# Patient Record
Sex: Male | Born: 1970 | Race: White | Hispanic: No | Marital: Married | Smoking: Never smoker
Health system: Southern US, Community
[De-identification: ages and names within clinical notes are randomized; demographics above are authoritative.]

## PROBLEM LIST (undated history)

## (undated) DIAGNOSIS — I10 Essential (primary) hypertension: Secondary | ICD-10-CM

## (undated) DIAGNOSIS — J45909 Unspecified asthma, uncomplicated: Secondary | ICD-10-CM

## (undated) DIAGNOSIS — L4050 Arthropathic psoriasis, unspecified: Secondary | ICD-10-CM

## (undated) HISTORY — PX: HERNIA REPAIR: SHX51

---

## 2010-09-01 ENCOUNTER — Ambulatory Visit: Payer: Self-pay | Admitting: Internal Medicine

## 2012-02-01 IMAGING — CR DG RIBS 2V*L*
1 series · 4 of 4 positions shown · non-contrast
Comparison: none

REASON FOR EXAM: fell, pain rib
COMMENTS:

PROCEDURE:     MDR - MDR RIBS LEFT UNILATERAL  - September 01, 2010  [DATE]
RESULT:     Images of the left ribs demonstrate no definite fracture or bony
destruction. The underlying lung appears to be fully inflated.

[Series 1: view not recorded · 0.17mm/px · 4 of 4 slices shown]
[im 1/4]
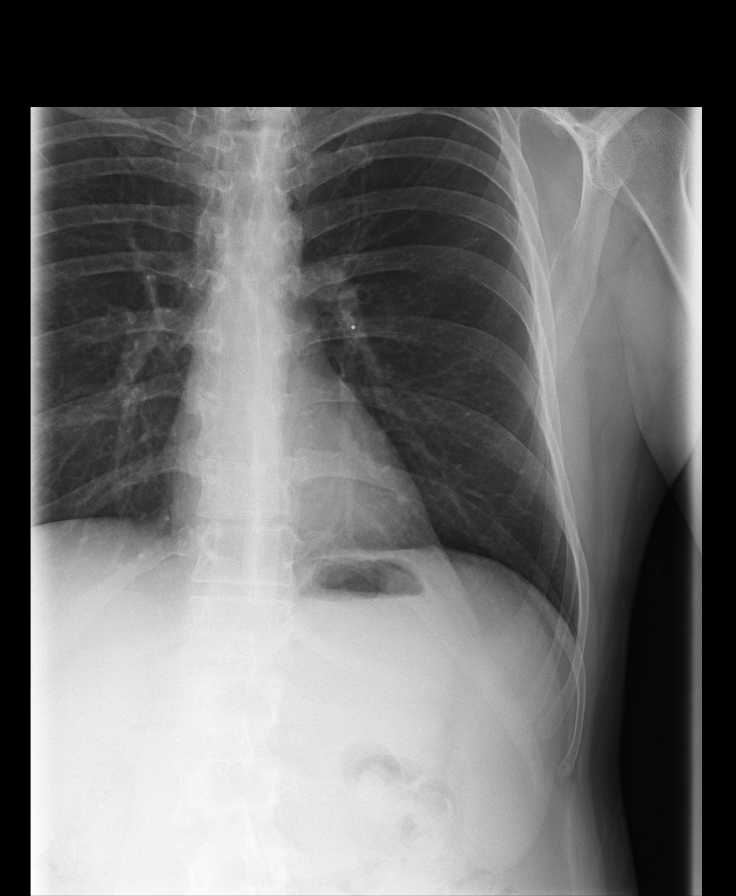
[im 2/4]
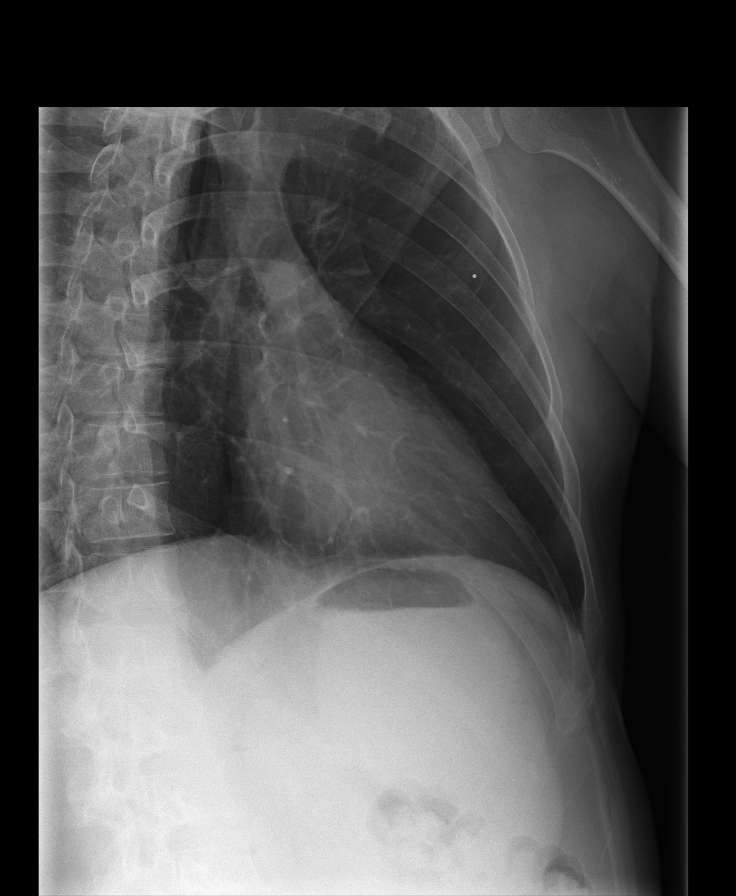
[im 3/4]
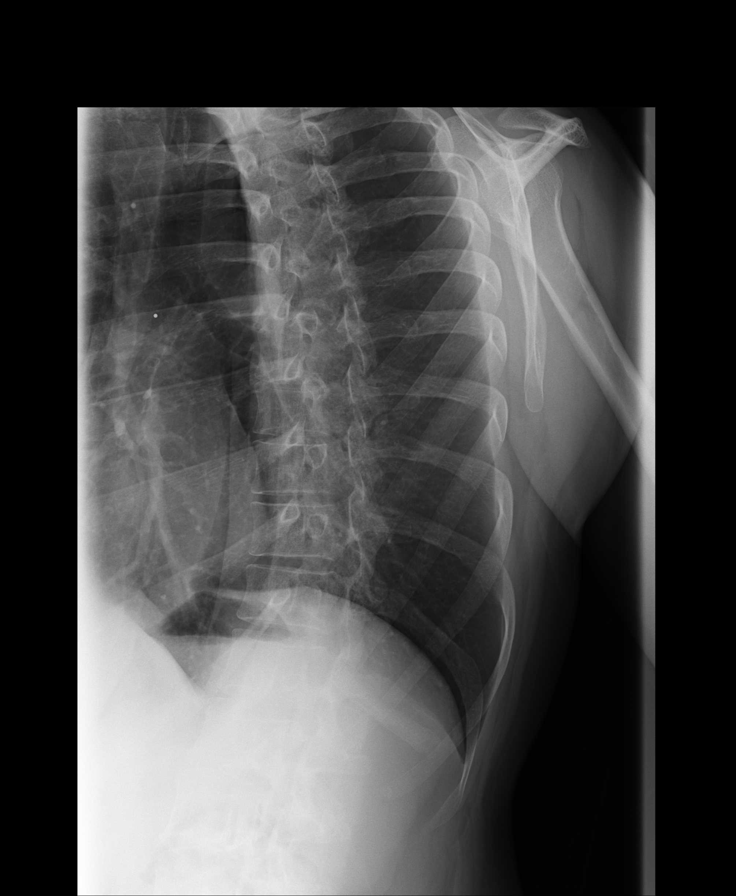
[im 4/4]
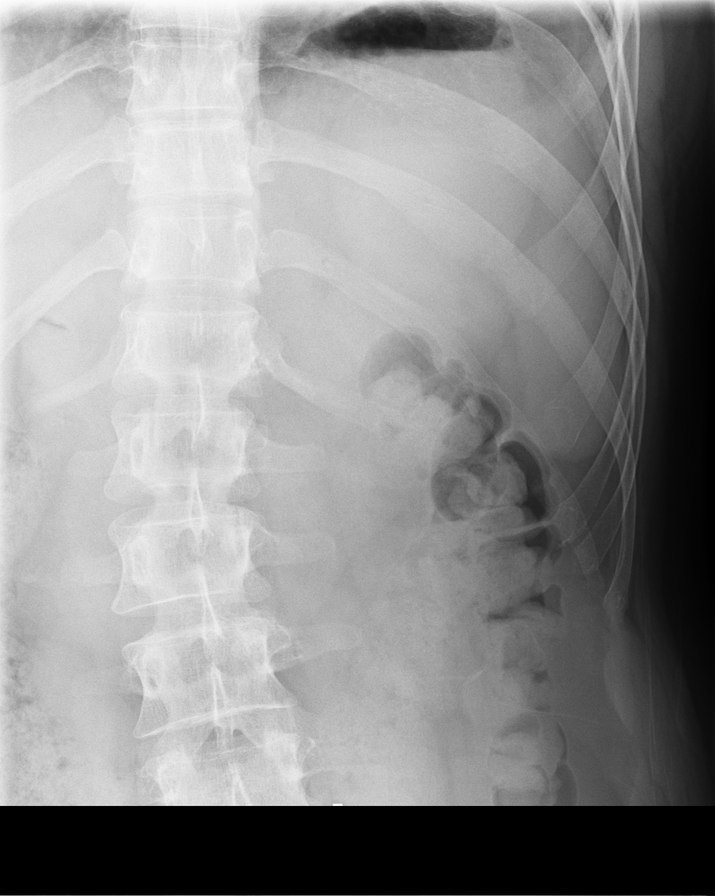

[4 of 4 positions shown; findings below may reference images not displayed]

IMPRESSION: No acute bony abnormality of the left ribs demonstrated.

## 2021-03-06 ENCOUNTER — Other Ambulatory Visit: Payer: Self-pay

## 2021-03-06 ENCOUNTER — Encounter: Payer: Self-pay | Admitting: Emergency Medicine

## 2021-03-06 ENCOUNTER — Ambulatory Visit
Admission: EM | Admit: 2021-03-06 | Discharge: 2021-03-06 | Disposition: A | Discharge status: Home / Self Care | Payer: BC Managed Care – PPO | Attending: Physician Assistant | Admitting: Physician Assistant

## 2021-03-06 DIAGNOSIS — R197 Diarrhea, unspecified: Secondary | ICD-10-CM | POA: Diagnosis not present

## 2021-03-06 DIAGNOSIS — Z20822 Contact with and (suspected) exposure to covid-19: Secondary | ICD-10-CM | POA: Diagnosis present

## 2021-03-06 HISTORY — DX: Essential (primary) hypertension: I10

## 2021-03-06 HISTORY — DX: Unspecified asthma, uncomplicated: J45.909

## 2021-03-06 HISTORY — DX: Arthropathic psoriasis, unspecified: L40.50

## 2021-03-06 NOTE — ED Provider Notes (Signed)
[facility]    CSN: [id number] Arrival date & time: 03/06/21  1607      History   Chief Complaint Chief Complaint  Patient presents with  . Headache    HPI Larry Thornton is a 50 y.o. male who presents with onset of fever less than 100 6 days ago and also had HA and thought it was related to his sinuses. As the day went on had mild ST and lower abd pain. Took med for HA and fever and fatigue. The med for HA did not help. Has had loose stool x 5 days, and some days were small firm stool. Some days is completely watery. Thought he was getting better, but now is watery again. Has taken pepto for a few days. The chest congestion which he keeps with his sinuses is actually better. Had negative Covid test the day of onset of symptom. The only thinks that has not resolved is his lower abd pain and diarrhea. In the past 24 h has gone 5 times. Denies blood in the stool. Denies antibiotic use or being out of the country in the past month.     Past Medical History:  Diagnosis Date  . Asthma   . Hypertension   . Psoriatic arthritis (HCC)     There are no problems to display for this patient.   Past Surgical History:  Procedure Laterality Date  . HERNIA REPAIR         Home Medications    Prior to Admission medications   Medication Sig Start Date End Date Taking? Authorizing Provider  Adalimumab (HUMIRA) 40 MG/0.8ML PSKT Inject into the skin. 11/07/14  Yes [provider]  cyanocobalamin 100 MCG tablet Take by mouth. 05/30/13  Yes [provider]  Fluticasone-Salmeterol (ADVAIR) 250-50 MCG/DOSE AEPB Inhale into the lungs. 08/20/14  Yes [provider]  folic acid (FOLVITE) 1 MG tablet Take 1 tablet by mouth daily. 05/19/16  Yes [provider]  Lifitegrast 5 % SOLN Apply to eye. 08/23/18  Yes [provider]  lisinopril (ZESTRIL) 10 MG tablet Take 1 tablet by mouth daily. 01/08/15  Yes [provider]  methotrexate  (RHEUMATREX) 2.5 MG tablet Take by mouth. 06/18/17  Yes [provider]  mometasone (NASONEX) 50 MCG/ACT nasal spray Place into the nose. 09/02/16  Yes [provider]  montelukast (SINGULAIR) 10 MG tablet Take by mouth. 11/07/18 05/29/21 Yes [provider]  pantoprazole (PROTONIX) 40 MG tablet Take by mouth. 09/09/15  Yes [provider]    Family History History reviewed. No pertinent family history.  Social History Social History   Tobacco Use  . Smoking status: Never Smoker  . Smokeless tobacco: Never Used  Vaping Use  . Vaping Use: Never used  Substance Use Topics  . Alcohol use: Yes  . Drug use: Not Currently     Allergies   Patient has no known allergies.   Review of Systems Review of Systems  Constitutional: Positive for fatigue and fever. Negative for appetite change, chills, diaphoresis and unexpected weight change.  HENT: Positive for postnasal drip and sinus pain. Negative for congestion, ear discharge, ear pain and sore throat.   Eyes: Negative for discharge.  Respiratory: Negative for cough.   Gastrointestinal: Positive for abdominal pain and diarrhea. Negative for abdominal distention, blood in stool, constipation, nausea and vomiting.  Musculoskeletal: Negative for gait problem and myalgias.  Skin: Negative for rash.  Neurological: Negative for headaches.  Hematological: Negative for adenopathy.  Physical Exam Triage Vital Signs ED Triage Vitals  Enc Vitals Group     BP 03/06/21 1622 (!) 153/101     Pulse Rate 03/06/21 1622 91     Resp 03/06/21 1622 18     Temp 03/06/21 1622 98.4 F (36.9 C)     Temp Source 03/06/21 1622 Oral     SpO2 03/06/21 1622 99 %     Weight 03/06/21 1621 180 lb (81.6 kg)     Height 03/06/21 1621 5\' 11"  (1.803 m)     Head Circumference --      Peak Flow --      Pain Score 03/06/21 1621 1     Pain Loc --      Pain Edu? --      Excl. in GC? --    No data found.  Updated Vital  Signs BP (!) 140/95 (BP Location: Left Arm)   Pulse 91   Temp 98.4 F (36.9 C) (Oral)   Resp 18   Ht 5\' 11"  (1.803 m)   Wt 180 lb (81.6 kg)   SpO2 99%   BMI 25.10 kg/m   Visual Acuity Right Eye Distance:   Left Eye Distance:   Bilateral Distance:    Right Eye Near:   Left Eye Near:    Bilateral Near:     Physical Exam Physical Exam Vitals signs and nursing note reviewed.  Constitutional:      General: he is not in acute distress.    Appearance: Normal appearance.he is not ill-appearing, toxic-appearing or diaphoretic.  HENT:     Head: Normocephalic.     Right Ear: Tympanic membrane, ear canal and external ear normal.     Left Ear: Tympanic membrane, ear canal and external ear normal.     Nose: Nose normal.     Mouth/Throat:     Mouth: Mucous membranes are moist.  Eyes:     General: No scleral icterus.       Right eye: No discharge.        Left eye: No discharge.     Conjunctiva/sclera: Conjunctivae normal.  Neck:     Musculoskeletal: Neck supple. No neck rigidity.  Cardiovascular:     Rate and Rhythm: Normal rate and regular rhythm.     Heart sounds: No murmur.  Pulmonary:     Effort: Pulmonary effort is normal.     Breath sounds: Normal breath sounds.  Abdominal:     General: Bowel sounds are normal. There is no distension.     Palpations: Abdomen is soft. There is no mass.     Tenderness: There is no abdominal tenderness. There is no guarding or rebound.  Musculoskeletal: Normal range of motion.  Lymphadenopathy:     Cervical: No cervical adenopathy.  Skin:    General: Skin is warm and dry.     Coloration: Skin is not jaundiced.     Findings: No rash.  Neurological:     Mental Status: he is alert and oriented to person, place, and time.     Gait: Gait normal.  Psychiatric:        Mood and Affect: Mood normal.        Behavior: Behavior normal.        Thought Content: Thought content normal.        Judgment: Judgment normal.     UC Treatments /  Results  Labs (all labs ordered are listed, but only abnormal results are displayed) Labs Reviewed  SARS CORONAVIRUS  2 (TAT 6-24 HRS)    EKG   Radiology No results found.  Procedures Procedures (including critical care time)  Medications Ordered in UC Medications - No data to display  Initial Impression / Assessment and Plan / UC Course  I have reviewed the triage vital signs and the nursing notes. Covid test is pending. Advised to continue BRAT diet, try vegetable broth, Winona Legato, and since he is a vegetarian prefers to take probiotics instead of bone broth for a couple of days. If diarrhea persists, then may collect and drop off for testing.   Final Clinical Impressions(s) / UC Diagnoses   Final diagnoses:  Diarrhea, unspecified type  Suspected COVID-19 virus infection   Discharge Instructions   None    ED Prescriptions    None     PDMP not reviewed this encounter.   Garey Ham, PA-C 03/06/21 1653

## 2021-03-06 NOTE — ED Triage Notes (Signed)
Pt c/o runny nose, diarrhea, subjective fever, and headache, fatigue. Started about 5 days ago. He states he took a home test at home and was negative.

## 2021-03-07 LAB — SARS CORONAVIRUS 2 (TAT 6-24 HRS): SARS Coronavirus 2: NEGATIVE
